# Patient Record
Sex: Female | Born: 1980 | Race: Asian | Hispanic: No | Marital: Married | Smoking: Never smoker
Health system: Southern US, Community
[De-identification: ages and names within clinical notes are randomized; demographics above are authoritative.]

---

## 2005-12-31 ENCOUNTER — Other Ambulatory Visit: Admission: RE | Admit: 2005-12-31 | Discharge: 2005-12-31 | Payer: Self-pay | Admitting: Family Medicine

## 2007-01-06 ENCOUNTER — Ambulatory Visit: Payer: Self-pay | Admitting: Internal Medicine

## 2007-01-07 ENCOUNTER — Ambulatory Visit: Payer: Self-pay | Admitting: Internal Medicine

## 2007-01-07 LAB — CONVERTED CEMR LAB
ALT: 10 units/L (ref 0–40)
AST: 22 units/L (ref 0–37)
Albumin: 3.7 g/dL (ref 3.5–5.2)
BUN: 9 mg/dL (ref 6–23)
Basophils Absolute: 0 10*3/uL (ref 0.0–0.1)
Basophils Relative: 0.2 % (ref 0.0–1.0)
Calcium: 9 mg/dL (ref 8.4–10.5)
Glucose, Bld: 90 mg/dL (ref 70–99)
HCT: 44.2 % (ref 36.0–46.0)
HDL: 47.2 mg/dL (ref >39.0)
Hemoglobin, Urine: NEGATIVE
Hemoglobin: 15.1 g/dL — ABNORMAL HIGH (ref 12.0–15.0)
Ketones, ur: NEGATIVE mg/dL
LDL Cholesterol: 105 mg/dL — ABNORMAL HIGH (ref 0–99)
Leukocytes, UA: NEGATIVE
MCHC: 34.1 g/dL (ref 30.0–36.0)
Neutrophils Relative %: 55.2 % (ref 43.0–77.0)
Nitrite: NEGATIVE
Potassium: 4.1 meq/L (ref 3.5–5.1)
RDW: 12.3 % (ref 11.5–14.6)
Sodium: 141 meq/L (ref 135–145)
TSH: 1.31 microintl units/mL (ref 0.35–5.50)
Total Protein, Urine: NEGATIVE mg/dL
Triglycerides: 44 mg/dL (ref 0–149)
Urine Glucose: NEGATIVE mg/dL
Urobilinogen, UA: 0.2 (ref 0.0–1.0)
WBC: 7.1 10*3/uL (ref 4.5–10.5)
pH: 5.5 (ref 5.0–8.0)

## 2007-07-08 ENCOUNTER — Ambulatory Visit: Payer: Self-pay | Admitting: Internal Medicine

## 2007-07-08 LAB — CONVERTED CEMR LAB
Bilirubin Urine: NEGATIVE
Crystals: NEGATIVE
Ketones, ur: NEGATIVE mg/dL
Specific Gravity, Urine: 1.01 (ref 1.000–1.03)
Urine Glucose: NEGATIVE mg/dL
Urobilinogen, UA: 0.2 (ref 0.0–1.0)
pH: 6 (ref 5.0–8.0)

## 2007-08-07 ENCOUNTER — Ambulatory Visit: Payer: Self-pay | Admitting: Family Medicine

## 2007-08-11 ENCOUNTER — Emergency Department (HOSPITAL_COMMUNITY): Admission: EM | Admit: 2007-08-11 | Discharge: 2007-08-12 | Payer: Self-pay | Admitting: Emergency Medicine

## 2007-08-20 ENCOUNTER — Ambulatory Visit: Payer: Self-pay | Admitting: Internal Medicine

## 2007-11-05 ENCOUNTER — Encounter: Payer: Self-pay | Admitting: Internal Medicine

## 2007-11-12 ENCOUNTER — Encounter: Payer: Self-pay | Admitting: Internal Medicine

## 2007-11-12 DIAGNOSIS — R51 Headache: Secondary | ICD-10-CM

## 2007-11-12 DIAGNOSIS — R519 Headache, unspecified: Secondary | ICD-10-CM | POA: Insufficient documentation

## 2008-02-10 ENCOUNTER — Telehealth: Payer: Self-pay | Admitting: Internal Medicine

## 2008-02-10 DIAGNOSIS — M26629 Arthralgia of temporomandibular joint, unspecified side: Secondary | ICD-10-CM
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
## 2009-07-20 ENCOUNTER — Ambulatory Visit (HOSPITAL_COMMUNITY): Admission: RE | Admit: 2009-07-20 | Discharge: 2009-07-20 | Payer: Self-pay | Admitting: Obstetrics

## 2009-10-31 ENCOUNTER — Ambulatory Visit (HOSPITAL_COMMUNITY): Admission: RE | Admit: 2009-10-31 | Discharge: 2009-10-31 | Payer: Self-pay | Admitting: Obstetrics

## 2010-01-02 ENCOUNTER — Inpatient Hospital Stay (HOSPITAL_COMMUNITY): Admission: RE | Admit: 2010-01-02 | Discharge: 2010-01-04 | Payer: Self-pay | Admitting: Obstetrics

## 2010-01-18 ENCOUNTER — Emergency Department (HOSPITAL_COMMUNITY): Admission: EM | Admit: 2010-01-18 | Discharge: 2010-01-18 | Payer: Self-pay | Admitting: Family Medicine

## 2011-01-26 LAB — POCT URINALYSIS DIP (DEVICE)
Specific Gravity, Urine: 1.02 (ref 1.005–1.030)
pH: 5.5 (ref 5.0–8.0)

## 2011-01-26 LAB — URINE CULTURE
Colony Count: NO GROWTH
Culture: NO GROWTH

## 2011-01-27 LAB — CBC
HCT: 31.7 % — ABNORMAL LOW (ref 36.0–46.0)
Hemoglobin: 12 g/dL (ref 12.0–15.0)
MCHC: 33.1 g/dL (ref 30.0–36.0)
MCV: 90 fL (ref 78.0–100.0)
MCV: 90.4 fL (ref 78.0–100.0)
Platelets: 258 10*3/uL (ref 150–400)
RBC: 3.52 MIL/uL — ABNORMAL LOW (ref 3.87–5.11)
WBC: 15 10*3/uL — ABNORMAL HIGH (ref 4.0–10.5)

## 2011-01-27 LAB — RPR: RPR Ser Ql: NONREACTIVE

## 2011-03-18 NOTE — Assessment & Plan Note (Signed)
[facility]                                 ON-CALL NOTE   NAME:Ford, Nicole                           MRN:          [id number]  DATE:07/08/2007                            DOB:          14-Nov-1980    PRIMARY CARE PHYSICIAN:  Dr. Artist Pais.   TELEPHONE NUMBER:  [phone]   TIME OF CALL:  9:28 p.m.   The patient complains of painful urination. She did see Dr. Artist Pais today  and was given cefuroxime. She has taken 1 tablet and states that she was  not feeling better and that the burning was too intense to get to sleep.  I called in Pyridium 200 mg, number of 6, 1 p.o. t.i.d. for two days. I  told her to follow up with Dr. Artist Pais if she does not improve.     Lelon Perla, DO  Electronically Signed    Shawnie Dapper  DD: 07/08/2007  DT: 07/09/2007  Job #: [id number]   cc:   Barbette Hair. Artist Pais, DO

## 2011-03-21 NOTE — Assessment & Plan Note (Signed)
[facility]                                 ON-CALL NOTE   NAME:Noe, Coco                           MRN:          [id number]  DATE:07/09/2007                            DOB:          1981/05/15    The patient called a second time July 08, 2007, at 10:13 p.m.  Patient of Dr. Artist Pais.  Called from [phone], saying she was expecting a  phone call to say that her medication was called in.  I explained to her  that I had told her I would call it in and she would need to wait an  hour to pick it up.  The patient medication has been called in to her  pharmacy, the Pyridium, and she can pick it up at [facility] in [place].     Lelon Perla, DO  Electronically Signed    Shawnie Dapper  DD: 07/09/2007  DT: 07/09/2007  Job #: [id number]   cc:   Barbette Hair. Artist Pais, DO
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
## 2011-08-14 LAB — POCT PREGNANCY, URINE
Operator id: 279831
Preg Test, Ur: NEGATIVE

## 2013-05-24 ENCOUNTER — Ambulatory Visit: Payer: Self-pay | Admitting: Family Medicine

## 2013-11-12 ENCOUNTER — Emergency Department (HOSPITAL_COMMUNITY)
Admission: EM | Admit: 2013-11-12 | Discharge: 2013-11-12 | Disposition: A | Discharge status: Home / Self Care | Payer: 59 | Source: Home / Self Care

## 2013-11-12 ENCOUNTER — Encounter (HOSPITAL_COMMUNITY): Payer: Self-pay | Admitting: Emergency Medicine

## 2013-11-12 DIAGNOSIS — J209 Acute bronchitis, unspecified: Secondary | ICD-10-CM

## 2013-11-12 DIAGNOSIS — J019 Acute sinusitis, unspecified: Secondary | ICD-10-CM

## 2013-11-12 MED ORDER — PREDNISONE 20 MG PO TABS: 20.0000 mg | ORAL_TABLET | Freq: Two times a day (BID) | ORAL | Status: AC

## 2013-11-12 MED ORDER — AMOXICILLIN-POT CLAVULANATE 875-125 MG PO TABS: 1.0000 | ORAL_TABLET | Freq: Two times a day (BID) | ORAL | Status: AC

## 2013-11-12 MED ORDER — METHYLPREDNISOLONE ACETATE 80 MG/ML IJ SUSP
INTRAMUSCULAR | Status: AC
  Filled 2013-11-12: qty 1

## 2013-11-12 MED ORDER — HYDROCOD POLST-CHLORPHEN POLST 10-8 MG/5ML PO LQCR
5.0000 mL | Freq: Two times a day (BID) | ORAL | Status: AC | PRN
Start: 2013-11-12 — End: ?

## 2013-11-12 MED ORDER — ALBUTEROL SULFATE HFA 108 (90 BASE) MCG/ACT IN AERS: 2.0000 | INHALATION_SPRAY | Freq: Four times a day (QID) | RESPIRATORY_TRACT | Status: AC

## 2013-11-12 MED ORDER — METHYLPREDNISOLONE ACETATE 80 MG/ML IJ SUSP
80.0000 mg | Freq: Once | INTRAMUSCULAR | Status: AC
  Administered 2013-11-12: 80 mg via INTRAMUSCULAR

## 2013-11-12 NOTE — ED Provider Notes (Signed)
Chief Complaint   Chief Complaint  Patient presents with  . Cough    History of Present Illness   Nicole Ford is a 33 year old female who has had a six-day history of dry cough, sore throat, abdominal soreness. She denies any fever, chills, wheezing, chest tightness, or chest pain. She has no history of asthma, but she does note that she tends to get similar episodes one or 2 times per year and a cough usually goes on for a month. She denies any nasal congestion, rhinorrhea, or postnasal drip. She has had no nausea or vomiting.  Review of Systems   Other than as noted above, the patient denies any of the following symptoms: Systemic:  No fevers, chills, sweats, or myalgias. Eye:  No redness or discharge. ENT:  No ear pain, headache, nasal congestion, drainage, sinus pressure, or sore throat. Neck:  No neck pain, stiffness, or swollen glands. Lungs:  No cough, sputum production, hemoptysis, wheezing, chest tightness, shortness of breath or chest pain. GI:  No abdominal pain, nausea, vomiting or diarrhea.  PMFSH   Past medical history, family history, social history, meds, and allergies were reviewed.  Physical exam   Vital signs:  BP 115/79  Pulse 78  Temp(Src) 98.2 F (36.8 C) (Oral)  Resp 18  SpO2 100%  LMP 10/12/2013 General:  Alert and oriented.  In no distress.  Skin warm and dry. Eye:  No conjunctival injection or drainage. Lids were normal. ENT:  TMs and canals were normal, without erythema or inflammation.  Nasal mucosa was clear and uncongested, without drainage.  Mucous membranes were moist.  Pharynx was erythematous with cobblestoning, but no exudate or drainage.  There were no oral ulcerations or lesions. Neck:  Supple, no adenopathy, tenderness or mass. Lungs:  No respiratory distress.  Lungs were clear to auscultation, without wheezes, rales or rhonchi.  Breath sounds were clear and equal bilaterally.  Heart:  Regular rhythm, without gallops, murmers or  rubs. Skin:  Clear, warm, and dry, without rash or lesions.   Course in Urgent Care Center   Given Depo-Medrol 80 mg IM.  Assessment     The primary encounter diagnosis was Acute sinusitis. A diagnosis of Acute bronchitis was also pertinent to this visit.  The erythematous throat with cobblestoning suggest post nasal drip as a possible causative factor for the cough. The fact that she has had recurring episodes suggest that she may have a mild asthmatic bronchitis.  Plan    1.  Meds:  The following meds were prescribed:   Discharge Medication List as of 11/12/2013  9:33 AM    START taking these medications   Details  albuterol (PROVENTIL HFA;VENTOLIN HFA) 108 (90 BASE) MCG/ACT inhaler Inhale 2 puffs into the lungs 4 (four) times daily., Starting 11/12/2013, Until Discontinued, Normal    amoxicillin-clavulanate (AUGMENTIN) 875-125 MG per tablet Take 1 tablet by mouth 2 (two) times daily., Starting 11/12/2013, Until Discontinued, Normal    chlorpheniramine-HYDROcodone (TUSSIONEX) 10-8 MG/5ML LQCR Take 5 mLs by mouth every 12 (twelve) hours as needed for cough., Starting 11/12/2013, Until Discontinued, Normal    predniSONE (DELTASONE) 20 MG tablet Take 1 tablet (20 mg total) by mouth 2 (two) times daily., Starting 11/12/2013, Until Discontinued, Normal        2.  Patient Education/Counseling:  The patient was given appropriate handouts, self care instructions, and instructed in symptomatic relief.  Instructed to get extra fluids, rest, and use a cool mist vaporizer.   3.  Follow up:  The patient was told to follow up here if no better in 3 to 4 days, or sooner if becoming worse in any way, and given some red flag symptoms such as increasing fever, difficulty breathing, chest pain, or persistent vomiting which would prompt immediate return.  Follow up here as needed.      Reuben Likesavid C London [place]aka, MD 11/12/13 [phone]

## 2013-11-12 NOTE — ED Notes (Signed)
Dry/itchy cough.  Evaluated by dr Lorenz Coasterkeller prior to this nurse

## 2013-11-12 NOTE — Discharge Instructions (Signed)
How to Use an Inhaler °Proper inhaler technique is very important. Good technique ensures that the medicine reaches the lungs. Poor technique results in depositing the medicine on the tongue and back of the throat rather than in the airways. If you do not use the inhaler with good technique, the medicine will not help you. °STEPS TO FOLLOW IF USING AN INHALER WITHOUT AN EXTENSION TUBE °1. Remove the cap from the inhaler. °2. If you are using the inhaler for the first time, you will need to prime it. Shake the inhaler for 5 seconds and release four puffs into the air, away from your face. Ask your health care provider or pharmacist if you have questions about priming your inhaler. °3. Shake the inhaler for 5 seconds before each breath in (inhalation). °4. Position the inhaler so that the top of the canister faces up. °5. Put your index finger on the top of the medicine canister. Your thumb supports the bottom of the inhaler. °6. Open your mouth. °7. Either place the inhaler between your teeth and place your lips tightly around the mouthpiece, or hold the inhaler 1 2 inches away from your open mouth. If you are unsure of which technique to use, ask your health care provider. °8. Breathe out (exhale) normally and as completely as possible. °9. Press the canister down with your index finger to release the medicine. °10. At the same time as the canister is pressed, inhale deeply and slowly until your lungs are completely filled. This should take 4 6 seconds. Keep your tongue down. °11. Hold the medicine in your lungs for 5 10 seconds (10 seconds is best). This helps the medicine get into the small airways of your lungs. °12. Breathe out slowly, through pursed lips. Whistling is an example of pursed lips. °13. Wait at least 15 30 seconds between puffs. Continue with the above steps until you have taken the number of puffs your health care provider has ordered. Do not use the inhaler more than your health care provider  tells you. °14. Replace the cap on the inhaler. °15. Follow the directions from your health care provider or the inhaler insert for cleaning the inhaler. °STEPS TO FOLLOW IF USING AN INHALER WITH AN EXTENSION (SPACER) °1. Remove the cap from the inhaler. °2. If you are using the inhaler for the first time, you will need to prime it. Shake the inhaler for 5 seconds and release four puffs into the air, away from your face. Ask your health care provider or pharmacist if you have questions about priming your inhaler. °3. Shake the inhaler for 5 seconds before each breath in (inhalation). °4. Place the open end of the spacer onto the mouthpiece of the inhaler. °5. Position the inhaler so that the top of the canister faces up and the spacer mouthpiece faces you. °6. Put your index finger on the top of the medicine canister. Your thumb supports the bottom of the inhaler and the spacer. °7. Breathe out (exhale) normally and as completely as possible. °8. Immediately after exhaling, place the spacer between your teeth and into your mouth. Close your lips tightly around the spacer. °9. Press the canister down with your index finger to release the medicine. °10. At the same time as the canister is pressed, inhale deeply and slowly until your lungs are completely filled. This should take 4 6 seconds. Keep your tongue down and out of the way. °11. Hold the medicine in your lungs for 5 10 seconds (10   seconds is best). This helps the medicine get into the small airways of your lungs. Exhale. °12. Repeat inhaling deeply through the spacer mouthpiece. Again hold that breath for up to 10 seconds (10 seconds is best). Exhale slowly. If it is difficult to take this second deep breath through the spacer, breathe normally several times through the spacer. Remove the spacer from your mouth. °13. Wait at least 15 30 seconds between puffs. Continue with the above steps until you have taken the number of puffs your health care provider has  ordered. Do not use the inhaler more than your health care provider tells you. °14. Remove the spacer from the inhaler, and place the cap on the inhaler. °15. Follow the directions from your health care provider or the inhaler insert for cleaning the inhaler and spacer. °If you are using different kinds of inhalers, use your quick relief medicine to open the airways 10 15 minutes before using a steroid if instructed to do so by your health care provider. If you are unsure which inhalers to use and the order of using them, ask your health care provider, nurse, or respiratory therapist. °If you are using a steroid inhaler, always rinse your mouth with water after your last puff, then gargle and spit out the water. Do not swallow the water. °AVOID: °· Inhaling before or after starting the spray of medicine. It takes practice to coordinate your breathing with triggering the spray. °· Inhaling through the nose (rather than the mouth) when triggering the spray. °HOW TO DETERMINE IF YOUR INHALER IS FULL OR NEARLY EMPTY °You cannot know when an inhaler is empty by shaking it. A few inhalers are now being made with dose counters. Ask your health care provider for a prescription that has a dose counter if you feel you need that extra help. If your inhaler does not have a counter, ask your health care provider to help you determine the date you need to refill your inhaler. Write the refill date on a calendar or your inhaler canister. Refill your inhaler 7 10 days before it runs out. Be sure to keep an adequate supply of medicine. This includes making sure it is not expired, and that you have a spare inhaler.  °SEEK MEDICAL CARE IF:  °· Your symptoms are only partially relieved with your inhaler. °· You are having trouble using your inhaler. °· You have some increase in phlegm. °SEEK IMMEDIATE MEDICAL CARE IF:  °· You feel little or no relief with your inhalers. You are still wheezing and are feeling shortness of breath or  tightness in your chest or both. °· You have dizziness, headaches, or a fast heart rate. °· You have chills, fever, or night sweats. °· You have a noticeable increase in phlegm production, or there is blood in the phlegm. °MAKE SURE YOU:  °· Understand these instructions. °· Will watch your condition. °· Will get help right away if you are not doing well or get worse. °Document Released: 10/17/2000 Document Revised: 08/10/2013 Document Reviewed: 05/19/2013 °ExitCare® Patient Information ©2014 [employer]. ° ° °Bronchitis °Bronchitis is inflammation of the airways that extend from the windpipe into the lungs (bronchi). The inflammation often causes mucus to develop, which leads to a cough. If the inflammation becomes severe, it may cause shortness of breath. °CAUSES  °Bronchitis may be caused by:  °· Viral infections.   °· Bacteria.   °· Cigarette smoke.   °· Allergens, pollutants, and other irritants.   °SIGNS AND SYMPTOMS  °The most common   symptom of bronchitis is a frequent cough that produces mucus. Other symptoms include:  Fever.   Body aches.   Chest congestion.   Chills.   Shortness of breath.   Sore throat.  DIAGNOSIS  Bronchitis is usually diagnosed through a medical history and physical exam. Tests, such as chest X-rays, are sometimes done to rule out other conditions.  TREATMENT  You may need to avoid contact with whatever caused the problem (smoking, for example). Medicines are sometimes needed. These may include:  Antibiotics. These may be prescribed if the condition is caused by bacteria.  Cough suppressants. These may be prescribed for relief of cough symptoms.   Inhaled medicines. These may be prescribed to help open your airways and make it easier for you to breathe.   Steroid medicines. These may be prescribed for those with recurrent (chronic) bronchitis. HOME CARE INSTRUCTIONS  Get plenty of rest.   Drink enough fluids to keep your urine clear or pale yellow  (unless you have a medical condition that requires fluid restriction). Increasing fluids may help thin your secretions and will prevent dehydration.   Only take over-the-counter or prescription medicines as directed by your health care provider.  Only take antibiotics as directed. Make sure you finish them even if you start to feel better.  Avoid secondhand smoke, irritating chemicals, and strong fumes. These will make bronchitis worse. If you are a smoker, quit smoking. Consider using nicotine gum or skin patches to help control withdrawal symptoms. Quitting smoking will help your lungs heal faster.   Put a cool-mist humidifier in your bedroom at night to moisten the air. This may help loosen mucus. Change the water in the humidifier daily. You can also run the hot water in your shower and sit in the bathroom with the door closed for 5 10 minutes.   Follow up with your health care provider as directed.   Wash your hands frequently to avoid catching bronchitis again or spreading an infection to others.  SEEK MEDICAL CARE IF: Your symptoms do not improve after 1 week of treatment.  SEEK IMMEDIATE MEDICAL CARE IF:  Your fever increases.  You have chills.   You have chest pain.   You have worsening shortness of breath.   You have bloody sputum.  You faint.  You have lightheadedness.  You have a severe headache.   You vomit repeatedly. MAKE SURE YOU:   Understand these instructions.  Will watch your condition.  Will get help right away if you are not doing well or get worse. Document Released: 10/20/2005 Document Revised: 08/10/2013 Document Reviewed: 06/14/2013 [facility] Patient Information 2014 [place], [place].  Sinusitis Sinusitis is redness, soreness, and swelling (inflammation) of the paranasal sinuses. Paranasal sinuses are air pockets within the bones of your face (beneath the eyes, the middle of the forehead, or above the eyes). In healthy paranasal sinuses,  mucus is able to drain out, and air is able to circulate through them by way of your nose. However, when your paranasal sinuses are inflamed, mucus and air can become trapped. This can allow bacteria and other germs to grow and cause infection. Sinusitis can develop quickly and last only a short time (acute) or continue over a long period (chronic). Sinusitis that lasts for more than 12 weeks is considered chronic.  CAUSES  Causes of sinusitis include:  Allergies.  Structural abnormalities, such as displacement of the cartilage that separates your nostrils (deviated septum), which can decrease the air flow through your nose and sinuses  and affect sinus drainage.  Functional abnormalities, such as when the small hairs (cilia) that line your sinuses and help remove mucus do not work properly or are not present. SYMPTOMS  Symptoms of acute and chronic sinusitis are the same. The primary symptoms are pain and pressure around the affected sinuses. Other symptoms include:  Upper toothache.  Earache.  Headache.  Bad breath.  Decreased sense of smell and taste.  A cough, which worsens when you are lying flat.  Fatigue.  Fever.  Thick drainage from your nose, which often is green and may contain pus (purulent).  Swelling and warmth over the affected sinuses. DIAGNOSIS  Your caregiver will perform a physical exam. During the exam, your caregiver may:  Look in your nose for signs of abnormal growths in your nostrils (nasal polyps).  Tap over the affected sinus to check for signs of infection.  View the inside of your sinuses (endoscopy) with a special imaging device with a light attached (endoscope), which is inserted into your sinuses. If your caregiver suspects that you have chronic sinusitis, one or more of the following tests may be recommended:  Allergy tests.  Nasal culture A sample of mucus is taken from your nose and sent to a lab and screened for bacteria.  Nasal cytology  A sample of mucus is taken from your nose and examined by your caregiver to determine if your sinusitis is related to an allergy. TREATMENT  Most cases of acute sinusitis are related to a viral infection and will resolve on their own within 10 days. Sometimes medicines are prescribed to help relieve symptoms (pain medicine, decongestants, nasal steroid sprays, or saline sprays).  However, for sinusitis related to a bacterial infection, your caregiver will prescribe antibiotic medicines. These are medicines that will help kill the bacteria causing the infection.  Rarely, sinusitis is caused by a fungal infection. In theses cases, your caregiver will prescribe antifungal medicine. For some cases of chronic sinusitis, surgery is needed. Generally, these are cases in which sinusitis recurs more than 3 times per year, despite other treatments. HOME CARE INSTRUCTIONS   Drink plenty of water. Water helps thin the mucus so your sinuses can drain more easily.  Use a humidifier.  Inhale steam 3 to 4 times a day (for example, sit in the bathroom with the shower running).  Apply a warm, moist washcloth to your face 3 to 4 times a day, or as directed by your caregiver.  Use saline nasal sprays to help moisten and clean your sinuses.  Take over-the-counter or prescription medicines for pain, discomfort, or fever only as directed by your caregiver. SEEK IMMEDIATE MEDICAL CARE IF:  You have increasing pain or severe headaches.  You have nausea, vomiting, or drowsiness.  You have swelling around your face.  You have vision problems.  You have a stiff neck.  You have difficulty breathing. MAKE SURE YOU:   Understand these instructions.  Will watch your condition.  Will get help right away if you are not doing well or get worse. Document Released: 10/20/2005 Document Revised: 01/12/2012 Document Reviewed: 11/04/2011 [facility] Patient Information 2014 [place], [place].
